# Patient Record
Sex: Male | Born: 1990 | Race: Black or African American | Hispanic: No | Marital: Single | Smoking: Current every day smoker
Health system: Southern US, Community
[De-identification: ages and names within clinical notes are randomized; demographics above are authoritative.]

---

## 2017-11-24 ENCOUNTER — Encounter: Payer: Self-pay | Admitting: Emergency Medicine

## 2017-11-24 DIAGNOSIS — Y999 Unspecified external cause status: Secondary | ICD-10-CM | POA: Insufficient documentation

## 2017-11-24 DIAGNOSIS — F17210 Nicotine dependence, cigarettes, uncomplicated: Secondary | ICD-10-CM | POA: Insufficient documentation

## 2017-11-24 DIAGNOSIS — W01198A Fall on same level from slipping, tripping and stumbling with subsequent striking against other object, initial encounter: Secondary | ICD-10-CM | POA: Insufficient documentation

## 2017-11-24 DIAGNOSIS — R066 Hiccough: Secondary | ICD-10-CM | POA: Insufficient documentation

## 2017-11-24 DIAGNOSIS — Y9301 Activity, walking, marching and hiking: Secondary | ICD-10-CM | POA: Insufficient documentation

## 2017-11-24 DIAGNOSIS — Y92238 Other place in hospital as the place of occurrence of the external cause: Secondary | ICD-10-CM | POA: Insufficient documentation

## 2017-11-24 DIAGNOSIS — R1013 Epigastric pain: Secondary | ICD-10-CM | POA: Insufficient documentation

## 2017-11-24 DIAGNOSIS — S0990XA Unspecified injury of head, initial encounter: Secondary | ICD-10-CM | POA: Insufficient documentation

## 2017-11-24 MED ORDER — PROCHLORPERAZINE EDISYLATE 10 MG/2ML IJ SOLN: 10.0000 mg | INTRAMUSCULAR | Status: DC

## 2017-11-24 MED ORDER — SODIUM CHLORIDE 0.9 % IV BOLUS: 1000.0000 mL | Freq: Once | INTRAVENOUS | Status: DC

## 2017-11-24 NOTE — ED Triage Notes (Signed)
Pt with hiccups x4 days without relief.

## 2017-11-25 ENCOUNTER — Emergency Department: Payer: Self-pay

## 2017-11-25 ENCOUNTER — Emergency Department
Admission: EM | Admit: 2017-11-25 | Discharge: 2017-11-25 | Disposition: A | Discharge status: Home / Self Care | Payer: Self-pay | Attending: Emergency Medicine | Admitting: Emergency Medicine

## 2017-11-25 DIAGNOSIS — R066 Hiccough: Secondary | ICD-10-CM

## 2017-11-25 LAB — GLUCOSE, CAPILLARY: GLUCOSE-CAPILLARY: 95 mg/dL (ref 70–99)

## 2017-11-25 MED ORDER — CHLORPROMAZINE HCL 50 MG PO TABS: 50.0000 mg | ORAL_TABLET | Freq: Three times a day (TID) | ORAL | 0 refills | Status: AC | PRN

## 2017-11-25 MED ORDER — CHLORPROMAZINE HCL 25 MG/ML IJ SOLN
50.0000 mg | Freq: Once | INTRAMUSCULAR | Status: AC
  Administered 2017-11-25: 50 mg via INTRAMUSCULAR
  Filled 2017-11-25: qty 2

## 2017-11-25 MED ORDER — SODIUM CHLORIDE 0.9 % IV BOLUS
1000.0000 mL | Freq: Once | INTRAVENOUS | Status: AC
  Administered 2017-11-25: 1000 mL via INTRAVENOUS

## 2017-11-25 NOTE — ED Notes (Signed)
Patient transported to CT 

## 2017-11-25 NOTE — ED Notes (Signed)
Hiccups x 4 days. Pt hiccupping presently.

## 2017-11-25 NOTE — ED Provider Notes (Signed)
[facility] Emergency Department Provider Note   ____________________________________________   First MD Initiated Contact with Patient 11/25/17 [phone]     (approximate)  I have reviewed the triage vital signs and the nursing notes.   HISTORY  Chief Complaint Hiccups    HPI Franklin Bates is a 27 y.o. male who presents to the ED from home with a chief complaint of hiccups.  Patient reports a 4-day history of intractable hiccups.  Had been drinking alcohol prior to start of hiccups.  Complains of some epigastric abdominal discomfort and constipation although he had a bowel movement yesterday.  Denies associated fever, chills, chest pain, shortness of breath, vomiting, diarrhea.  Denies recent travel or trauma.   Past medical history None  There are no active problems to display for this patient.   History reviewed. No pertinent surgical history.  Prior to Admission medications   Medication Sig Start Date End Date Taking? Authorizing Provider  chlorproMAZINE (THORAZINE) 50 MG tablet Take 1 tablet (50 mg total) by mouth 3 (three) times daily as needed for hiccoughs. 11/25/17   Irean HongSung, Jade J, MD    Allergies Patient has no known allergies.  No family history on file.  Social History Social History   Tobacco Use  . Smoking status: Current Every Day Smoker  . Smokeless tobacco: Never Used  Substance Use Topics  . Alcohol use: Yes  . Drug use: Not Currently    Review of Systems  Constitutional: No fever/chills Eyes: No visual changes. ENT: No sore throat. Cardiovascular: Denies chest pain. Respiratory: Denies shortness of breath. Gastrointestinal: Positive for hiccups.  No abdominal pain.  Positive for nausea, no vomiting.  No diarrhea.  No constipation. Genitourinary: Negative for dysuria. Musculoskeletal: Negative for back pain. Skin: Negative for rash. Neurological: Negative for headaches, focal weakness or  numbness.   ____________________________________________   PHYSICAL EXAM:  VITAL SIGNS: ED Triage Vitals  Enc Vitals Group     BP 11/24/17 2301 116/62     Pulse Rate 11/24/17 2301 69     Resp 11/24/17 2301 17     Temp 11/24/17 2301 97.8 F (36.6 C)     Temp Source 11/24/17 2301 Oral     SpO2 11/24/17 2301 99 %     Weight 11/24/17 2302 175 lb (79.4 kg)     Height 11/24/17 2302 6\' 3"  (1.905 m)     Head Circumference --      Peak Flow --      Pain Score --      Pain Loc --      Pain Edu? --      Excl. in GC? --     Constitutional: Asleep, awakened for exam.  Sleeping with active hiccups.  Alert and oriented. Well appearing and in no acute distress. Eyes: Conjunctivae are normal. PERRL. EOMI. Head: Atraumatic. Nose: No congestion/rhinnorhea. Mouth/Throat: Mucous membranes are moist.  Oropharynx non-erythematous. Neck: No stridor.   Cardiovascular: Normal rate, regular rhythm. Grossly normal heart sounds.  Good peripheral circulation. Respiratory: Normal respiratory effort.  No retractions. Lungs CTAB. Gastrointestinal: Soft and nontender to light or deep palpation. No distention. No abdominal bruits. No CVA tenderness. Musculoskeletal: No lower extremity tenderness nor edema.  No joint effusions. Neurologic:  Normal speech and language. No gross focal neurologic deficits are appreciated. No gait instability. Skin:  Skin is warm, dry and intact. No rash noted. Psychiatric: Mood and affect are normal. Speech and behavior are normal.  ____________________________________________   LABS (all labs  ordered are listed, but only abnormal results are displayed)  Labs Reviewed  GLUCOSE, CAPILLARY  CBG MONITORING, ED   ____________________________________________  EKG  None ____________________________________________  RADIOLOGY  ED MD interpretation: No SBO, no free air  Official radiology report(s): Ct Head Wo Contrast  Result Date: 11/25/2017 CLINICAL DATA:  Status  post unwitnessed fall. Concern for head injury. EXAM: CT HEAD WITHOUT CONTRAST TECHNIQUE: Contiguous axial images were obtained from the base of the skull through the vertex without intravenous contrast. COMPARISON:  None. FINDINGS: Brain: No evidence of acute infarction, hemorrhage, hydrocephalus, extra-axial collection or mass lesion/mass effect. The posterior fossa, including the cerebellum, brainstem and fourth ventricle, is within normal limits. The third and lateral ventricles, and basal ganglia are unremarkable in appearance. The cerebral hemispheres are symmetric in appearance, with normal gray-white differentiation. No mass effect or midline shift is seen. Vascular: No hyperdense vessel or unexpected calcification. Skull: There is no evidence of fracture; visualized osseous structures are unremarkable in appearance. Sinuses/Orbits: The orbits are within normal limits. The paranasal sinuses and mastoid air cells are well-aerated. Other: No significant soft tissue abnormalities are seen. IMPRESSION: No evidence of traumatic intracranial injury or fracture. Electronically Signed   By: Roanna Raider M.D.   On: 11/25/2017 05:21   Dg Abd Acute W/chest  Result Date: 11/25/2017 CLINICAL DATA:  Hiccups x4 days.  Abdominal pain and constipation. EXAM: DG ABDOMEN ACUTE W/ 1V CHEST COMPARISON:  None. FINDINGS: S-shaped scoliotic curvature of the thoracolumbar spine. Clear lungs with normal heart size and mediastinal contours. Unremarkable bowel gas pattern. No significant stool retention identified within the colon. No small bowel dilatation. Phleboliths are present within the pelvis bilaterally. No apparent genitourinary calculi. IMPRESSION: S-shaped scoliosis of the thoracolumbar spine. No bowel obstruction or dilatation. No radiopaque calculi. Clear lungs. Electronically Signed   By: Tollie Eth M.D.   On: 11/25/2017 02:27    ____________________________________________   PROCEDURES  Procedure(s)  performed: None  Procedures  Critical Care performed: No  ____________________________________________   INITIAL IMPRESSION / ASSESSMENT AND PLAN / ED COURSE  As part of my medical decision making, I reviewed the following data within the electronic MEDICAL RECORD NUMBER Nursing notes reviewed and incorporated, Old chart reviewed, Radiograph reviewed and Notes from prior ED visits   27 year old male who presents with intractable hiccups.  Parenteral diagnosis includes but is not limited to GERD, diaphragmatic inflammation, bowel obstruction, etc.  Will administer Thorazine, obtain abdominal three-way x-rays to evaluate for obstruction.  Clinical Course as of Nov 25 709  Tue Nov 25, 2017  0345 Updated patient of imaging result.  He is feeling significant better.  Will discharge home with prescription for Thorazine to use as needed.  Strict return precautions given.  Patient verbalizes understanding and agrees with plan of care.   [JS]  0424 Patient tried to ambulate to the restroom and fell, striking his head.  Will obtain CT head.  Will check blood sugar.   [JS]  0541 CT head is negative.  FSBS 95.  We will continue to monitor in the ED until patient is more awake from his Thorazine.   [JS]  [id number] Patient given IV fluids.  Resting at this time.  Anticipate discharge home once he is awake and ambulatory.   [JS]    Clinical Course User Index [JS] Irean Hong, MD     ____________________________________________   FINAL CLINICAL IMPRESSION(S) / ED DIAGNOSES  Final diagnoses:  Intractable hiccups     ED Discharge Orders  Ordered    chlorproMAZINE (THORAZINE) 50 MG tablet  3 times daily PRN     11/25/17 0346       Note:  This document was prepared using Dragon voice recognition software and may include unintentional dictation errors.    Irean Hong, MD 11/25/17 [phone]

## 2017-11-25 NOTE — ED Notes (Addendum)
Pt ambulated down hall approximately 200 feet with this tech and Kennith Centerracey, EDT. Pt did not appear unsteady, RN made aware.

## 2017-11-25 NOTE — Discharge Instructions (Addendum)
1.  You may take Thorazine 3 times daily as needed for hiccups. 2.  Return to the ER for worsening symptoms, persistent vomiting, difficulty breathing or other concerns.

## 2017-11-25 NOTE — ED Notes (Signed)
Ambulated patient.  Gait steady.  NAD.  Ambulated independently.  VSS  Heart rate 70-80's.  Skin warm and dry.  Reviewed patient status with Dr. Cyril LoosenKinner, and ok to discharge home.

## 2017-11-25 NOTE — ED Notes (Signed)
AAOx3.  Skin warm and dry.  NAD 

## 2017-11-25 NOTE — ED Notes (Signed)
Assisted patient to bathroom.  Gait remains unsteady and patient requires assistance.  Vital signs reassessed after ambulation.  Patient HR remains 40-50's.  Dr. Cyril LoosenKinner alerted to patient gait and VS.  EKG ordered and obtained.  Will continue to monitor patient.

## 2017-11-26 ENCOUNTER — Emergency Department
Admission: EM | Admit: 2017-11-26 | Discharge: 2017-11-26 | Disposition: A | Discharge status: Home / Self Care | Payer: Self-pay | Attending: Emergency Medicine | Admitting: Emergency Medicine

## 2017-11-26 ENCOUNTER — Other Ambulatory Visit: Payer: Self-pay

## 2017-11-26 DIAGNOSIS — F17200 Nicotine dependence, unspecified, uncomplicated: Secondary | ICD-10-CM | POA: Insufficient documentation

## 2017-11-26 DIAGNOSIS — H6121 Impacted cerumen, right ear: Secondary | ICD-10-CM | POA: Insufficient documentation

## 2017-11-26 MED ORDER — GI COCKTAIL ~~LOC~~
30.0000 mL | Freq: Once | ORAL | Status: AC
  Administered 2017-11-26: 30 mL via ORAL
  Filled 2017-11-26: qty 30

## 2017-11-26 MED ORDER — METOCLOPRAMIDE HCL 10 MG PO TABS
10.0000 mg | ORAL_TABLET | Freq: Once | ORAL | Status: AC
  Administered 2017-11-26: 10 mg via ORAL
  Filled 2017-11-26: qty 1

## 2017-11-26 MED ORDER — METOCLOPRAMIDE HCL 10 MG PO TABS: 10.0000 mg | ORAL_TABLET | Freq: Three times a day (TID) | ORAL | 0 refills | Status: AC

## 2017-11-26 MED ORDER — DOCUSATE SODIUM 100 MG PO CAPS
100.0000 mg | ORAL_CAPSULE | Freq: Once | ORAL | Status: AC
  Administered 2017-11-26: 100 mg via ORAL
  Filled 2017-11-26: qty 1

## 2017-11-26 MED ORDER — LIDOCAINE VISCOUS HCL 2 % MT SOLN
15.0000 mL | Freq: Once | OROMUCOSAL | Status: AC
  Administered 2017-11-26: 15 mL via OROMUCOSAL

## 2017-11-26 MED ORDER — LIDOCAINE VISCOUS HCL 2 % MT SOLN
OROMUCOSAL | Status: AC
Start: 2017-11-26 — End: 2017-11-26
  Administered 2017-11-26: 15 mL via OROMUCOSAL
  Filled 2017-11-26: qty 15

## 2017-11-26 NOTE — Discharge Instructions (Signed)
Is unclear if your earwax impaction in your ear was resulting in the hiccups.  Is my hope that it was not by tomorrow I think should taper off after that nerve calms down.  Return to the emergency room for any new or worrisome symptoms.  Take the Reglan as directed it should help as well, follow closely with ENT as an outpatient.

## 2017-11-26 NOTE — ED Notes (Signed)
Pt reports feeling better; no c/o hiccups since ear irrigation and cerumen impaction was removed by Dr Alphonzo LemmingsMcShane.

## 2017-11-26 NOTE — ED Triage Notes (Signed)
Pt was seen here for uncontrolled hiccups for the past week and the medication that was prescribed he is not able to afford, states he just moved here and does not have the money for it.

## 2017-11-26 NOTE — ED Provider Notes (Addendum)
[facility] Emergency Department Provider Note  ____________________________________________   I have reviewed the triage vital signs and the nursing notes. Where available I have reviewed prior notes and, if possible and indicated, outside hospital notes.    HISTORY  Chief Complaint medication    HPI Franklin Bates is a 27 y.o. male  Who has had hiccups for the last 5 days, never stops for more than a few minutes, tried Thorazine in the department felt a little bit better went home and then had hiccups that started right away.  He states has been hiccuping ever since.  He try to get the Thorazine prescription filled but is too expensive.  He has no other complaints.  Denies any chest pain shortness breath nausea or vomiting.  He says sometimes the hiccups give him heartburn.  Have extensive work-up in the emergency room yesterday.  No history of oncologic problems, no chest pain or shortness of breath etc.    History reviewed. No pertinent past medical history.  There are no active problems to display for this patient.   History reviewed. No pertinent surgical history.  Prior to Admission medications   Medication Sig Start Date End Date Taking? Authorizing Provider  chlorproMAZINE (THORAZINE) 50 MG tablet Take 1 tablet (50 mg total) by mouth 3 (three) times daily as needed for hiccoughs. 11/25/17   Irean Hong, MD    Allergies Patient has no known allergies.  No family history on file.  Social History Social History   Tobacco Use  . Smoking status: Current Every Day Smoker  . Smokeless tobacco: Never Used  Substance Use Topics  . Alcohol use: Yes  . Drug use: Not Currently    Review of Systems Constitutional: No fever/chills Eyes: No visual changes. ENT: No sore throat. No stiff neck no neck pain Cardiovascular: Denies chest pain. Respiratory: Denies shortness of breath. Gastrointestinal:   no vomiting.  No diarrhea.  No  constipation. Genitourinary: Negative for dysuria. Musculoskeletal: Negative lower extremity swelling Skin: Negative for rash. Neurological: Negative for severe headaches, focal weakness or numbness.   ____________________________________________   PHYSICAL EXAM:  VITAL SIGNS: ED Triage Vitals  Enc Vitals Group     BP 11/26/17 1805 124/89     Pulse Rate 11/26/17 1805 92     Resp 11/26/17 1805 17     Temp 11/26/17 1805 98.3 F (36.8 C)     Temp Source 11/26/17 1805 Oral     SpO2 11/26/17 1805 97 %     Weight 11/26/17 1806 175 lb (79.4 kg)     Height 11/26/17 1806 6\' 3"  (1.905 m)     Head Circumference --      Peak Flow --      Pain Score 11/26/17 1806 9     Pain Loc --      Pain Edu? --      Excl. in GC? --     Constitutional: Alert and oriented. Well appearing and in no acute distress.  Hiccuping Eyes: Conjunctivae are normal Head: Atraumatic HEENT: No congestion/rhinnorhea. Mucous membranes are moist.  Oropharynx non-erythematous, left TM is normal right TM is impacted with cerumen Neck:   Nontender with no meningismus, no masses, no stridor Cardiovascular: Normal rate, regular rhythm. Grossly normal heart sounds.  Good peripheral circulation. Respiratory: Normal respiratory effort.  No retractions. Lungs CTAB. Abdominal: Soft and nontender. No distention. No guarding no rebound Back:  There is no focal tenderness or step off.  there is no midline  tenderness there are no lesions noted. there is no CVA tenderness Musculoskeletal: No lower extremity tenderness, no upper extremity tenderness. No joint effusions, no DVT signs strong distal pulses no edema Neurologic:  Normal speech and language. No gross focal neurologic deficits are appreciated.  Skin:  Skin is warm, dry and intact. No rash noted. Psychiatric: Mood and affect are normal. Speech and behavior are normal.  ____________________________________________   LABS (all labs ordered are listed, but only abnormal  results are displayed)  Labs Reviewed - No data to display  Pertinent labs  results that were available during my care of the patient were reviewed by me and considered in my medical decision making (see chart for details). ____________________________________________  EKG  I personally interpreted any EKGs ordered by me or triage  ____________________________________________  RADIOLOGY  Pertinent labs & imaging results that were available during my care of the patient were reviewed by me and considered in my medical decision making (see chart for details). If possible, patient and/or family made aware of any abnormal findings.  No results found. ____________________________________________    PROCEDURES  Procedure(s) performed: Cerumen disimpaction, timeout performed, risk benefits alternatives explained. Using initially Colace to soften it and then irrigating several times I was able to remove a great water cerumen from his right POC.  Patient tolerated well no complications.  Visualized TM afterwards was normal.  Procedures  Critical Care performed: None  ____________________________________________   INITIAL IMPRESSION / ASSESSMENT AND PLAN / ED COURSE  Pertinent labs & imaging results that were available during my care of the patient were reviewed by me and considered in my medical decision making (see chart for details).  Patient with recurrent hiccups, I did notice a large TM impaction, we removed it and put viscous lidocaine in his ear, the hiccups stopped for about 45 minutes as soon as we were done with that.  He has had a few hiccups since that time.  Nothing sustained however.  We will see if we can send him home on Reglan.  Low suspicion for acute oncologic pathology or centrally mediated event, CT was negative, I do not think CT scan is indicated unless this continues we will refer him as an outpatient to ENT.  ----------------------------------------- 9:05 PM  on 11/26/2017 ----------------------------------------- Patient feeling much better we will send home on Reglan which should be more affordable and return precautions follow-up given understood.  Informed not to take Reglan and Thorazine at same time.     ____________________________________________   FINAL CLINICAL IMPRESSION(S) / ED DIAGNOSES  Final diagnoses:  None      This chart was dictated using voice recognition software.  Despite best efforts to proofread,  errors can occur which can change meaning.      Jeanmarie PlantMcShane, Dandrea Widdowson A, MD 11/26/17 2056    Jeanmarie PlantMcShane, Jalee Saine A, MD 11/26/17 2106

## 2019-07-23 IMAGING — CT CT HEAD W/O CM
3 series · 15 of 47 positions shown, 18 images · non-contrast
Comparison: None.

CLINICAL DATA: Status post unwitnessed fall. Concern for head
injury.

EXAM:
CT HEAD WITHOUT CONTRAST
TECHNIQUE: Contiguous axial images were obtained from the base of the skull
through the vertex without intravenous contrast.

[Series 2: head wo · axial · 0.42mm/px · z∈[-171,-46]mm · 9 of 31 slices shown, 12 images]
[im 3/31  brain]
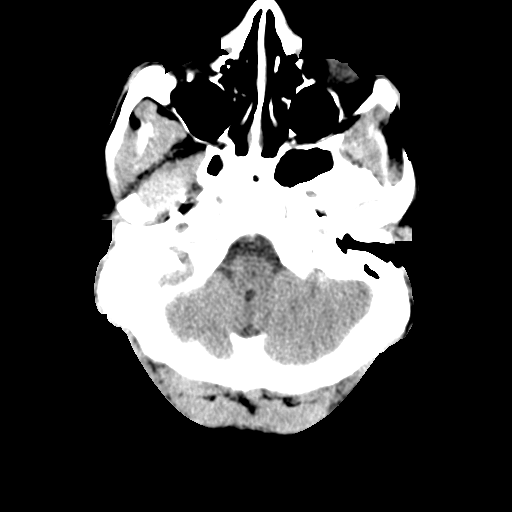
[im 3/31  bone]
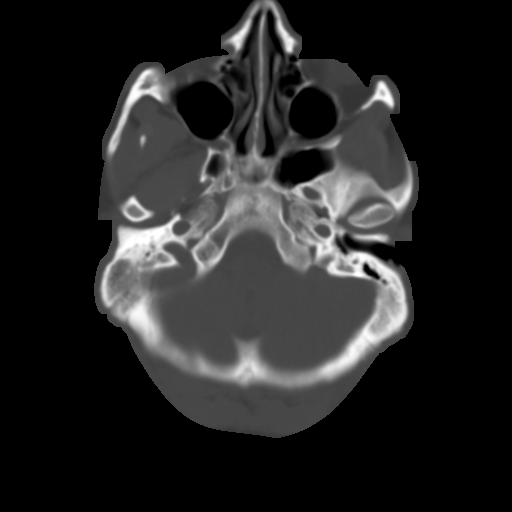
[im 6/31  brain]
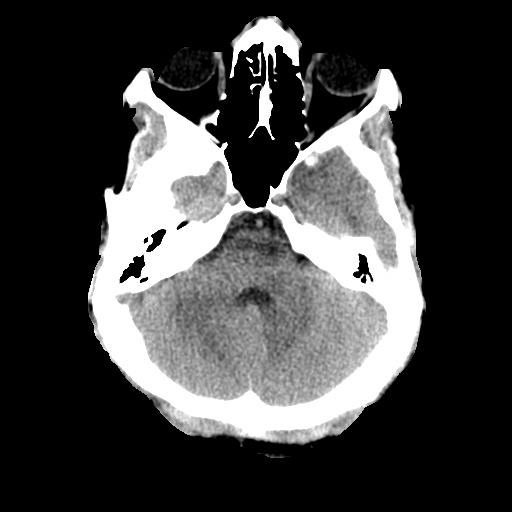
[im 9/31  brain]
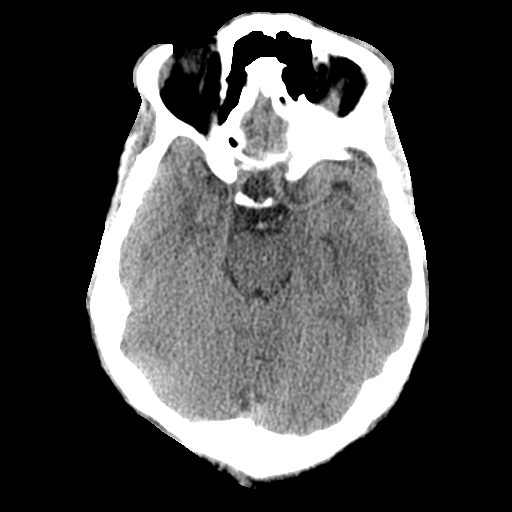
[im 12/31  brain]
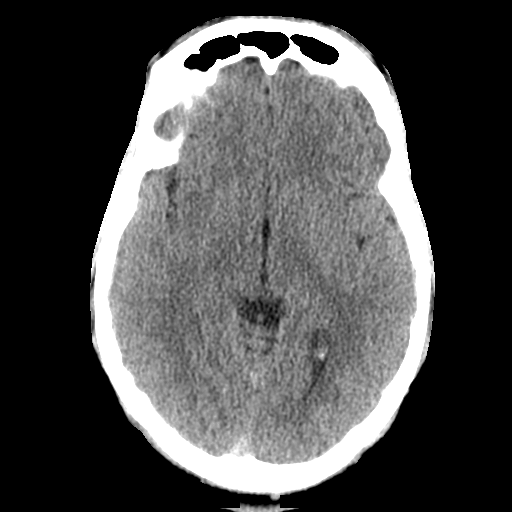
[im 16/31  brain]
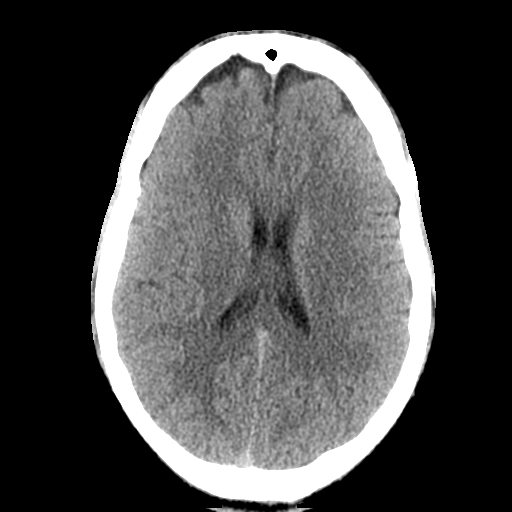
[im 16/31  bone]
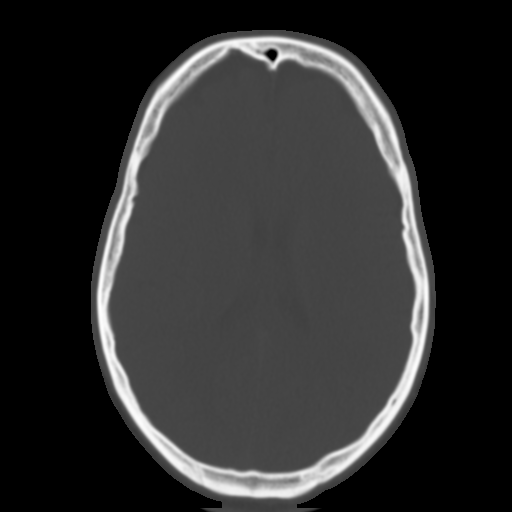
[im 19/31  brain]
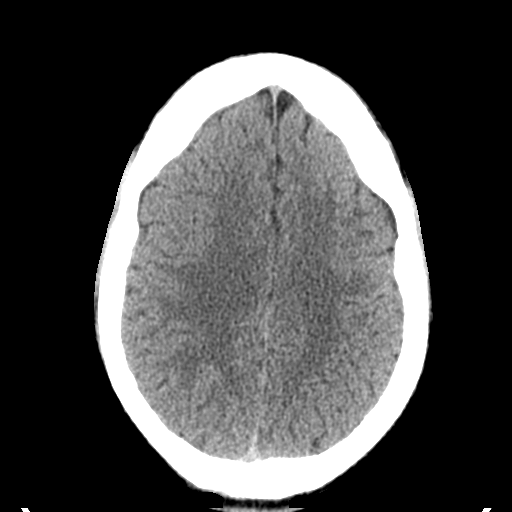
[im 22/31  brain]
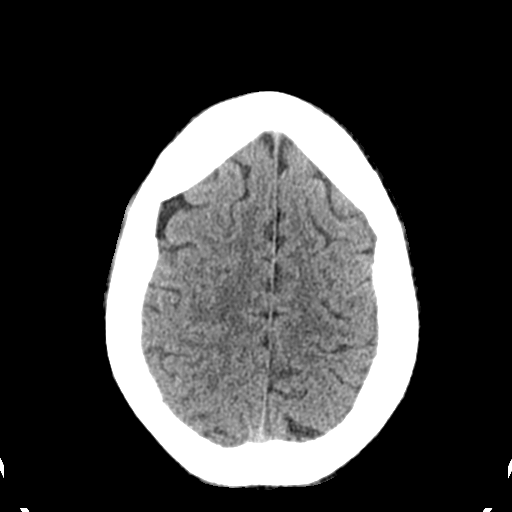
[im 25/31  brain]
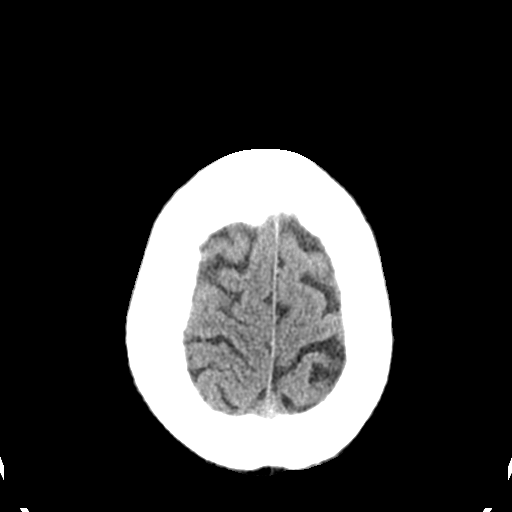
[im 28/31  brain]
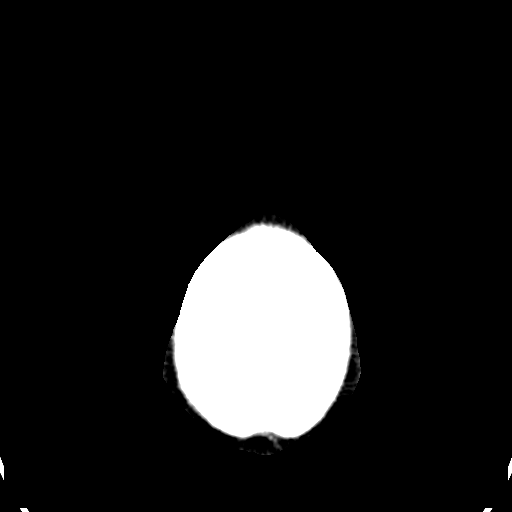
[im 28/31  bone]
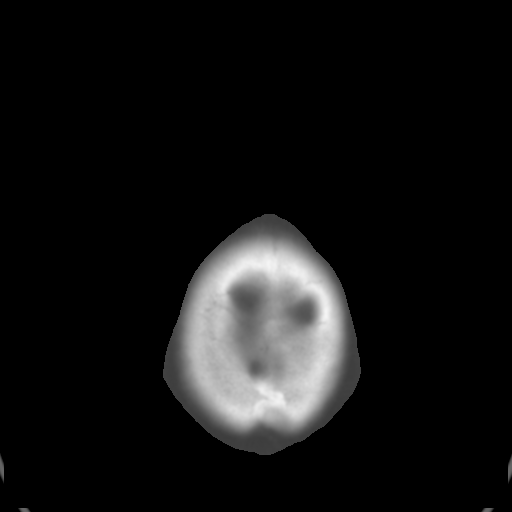

[Series 4: coronal soft tissue · coronal · 0.31mm/px · 3 of 71 slices shown]
[im 24/71  brain]
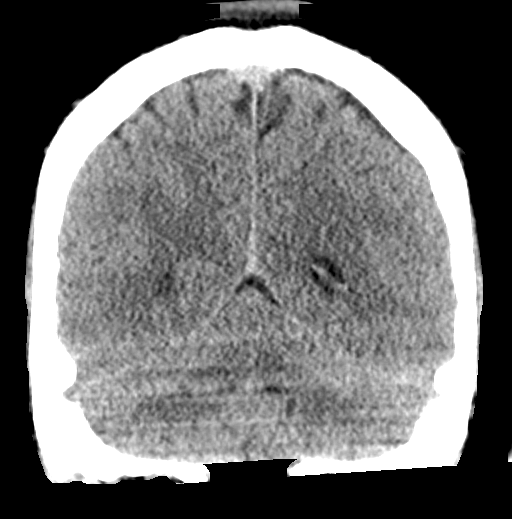
[im 32/71  brain]
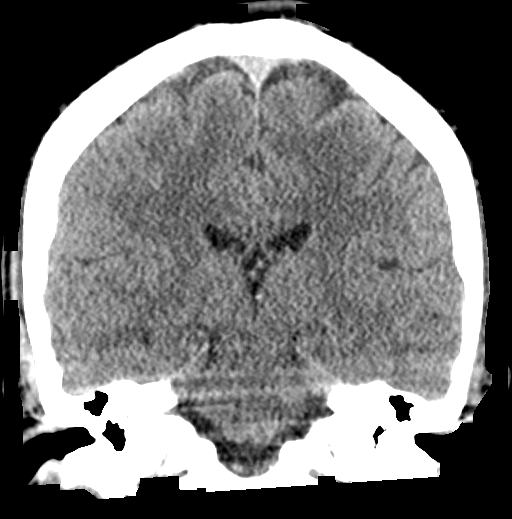
[im 39/71  brain]
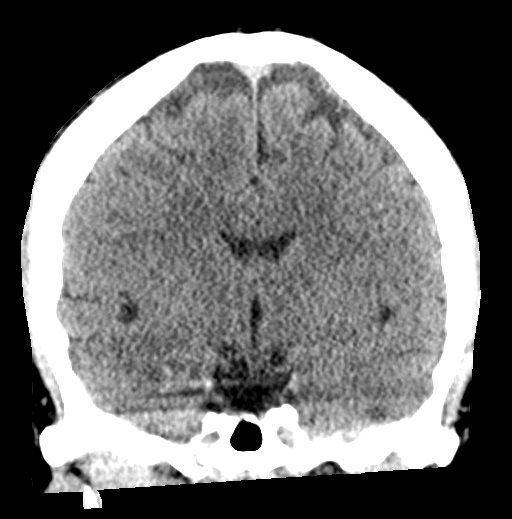

[Series 5: sagittal soft tissue · sagittal · 0.31mm/px · 3 of 53 slices shown]
[im 18/53  brain]
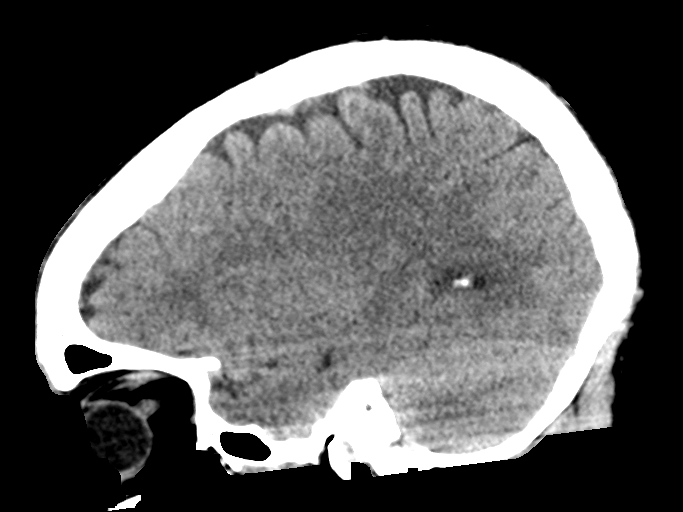
[im 27/53  brain]
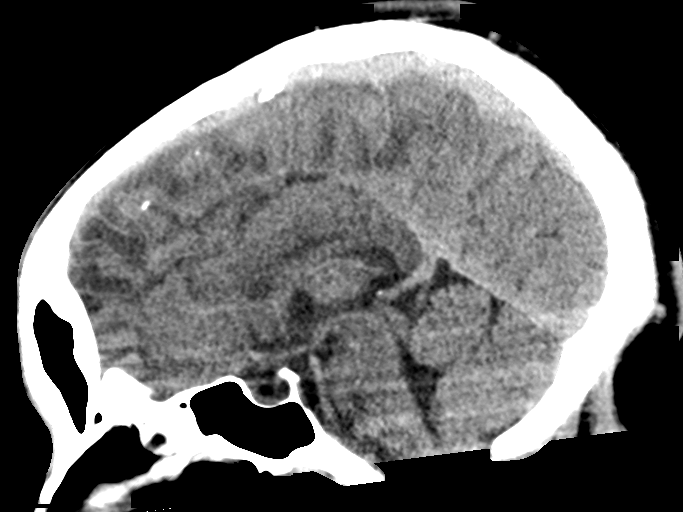
[im 35/53  brain]
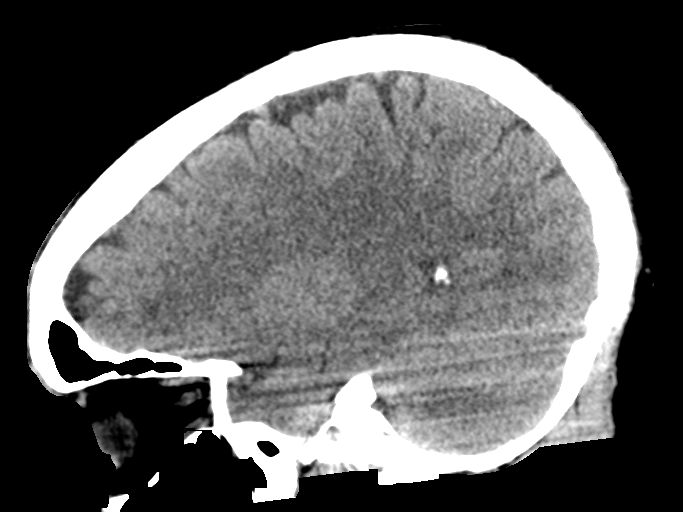

[15 of 47 positions shown; findings below may reference images not displayed]

FINDINGS: Brain: No evidence of acute infarction, hemorrhage, hydrocephalus,
extra-axial collection or mass lesion/mass effect.

The posterior fossa, including the cerebellum, brainstem and fourth
ventricle, is within normal limits. The third and lateral
ventricles, and basal ganglia are unremarkable in appearance. The
cerebral hemispheres are symmetric in appearance, with normal
gray-white differentiation. No mass effect or midline shift is seen.

Vascular: No hyperdense vessel or unexpected calcification.

Skull: There is no evidence of fracture; visualized osseous
structures are unremarkable in appearance.

Sinuses/Orbits: The orbits are within normal limits. The paranasal
sinuses and mastoid air cells are well-aerated.

Other: No significant soft tissue abnormalities are seen.
IMPRESSION: No evidence of traumatic intracranial injury or fracture.
